# Patient Record
Sex: Female | Born: 1965 | Race: Black or African American | Hispanic: No | Marital: Married | Smoking: Never smoker
Health system: Southern US, Community
[De-identification: ages and names within clinical notes are randomized; demographics above are authoritative.]

## PROBLEM LIST (undated history)

## (undated) DIAGNOSIS — I10 Essential (primary) hypertension: Secondary | ICD-10-CM

## (undated) DIAGNOSIS — E079 Disorder of thyroid, unspecified: Secondary | ICD-10-CM

## (undated) DIAGNOSIS — J45909 Unspecified asthma, uncomplicated: Secondary | ICD-10-CM

---

## 2009-05-12 ENCOUNTER — Encounter (INDEPENDENT_AMBULATORY_CARE_PROVIDER_SITE_OTHER): Payer: Self-pay | Admitting: *Deleted

## 2009-05-20 ENCOUNTER — Ambulatory Visit: Payer: Self-pay | Admitting: Internal Medicine

## 2009-05-20 DIAGNOSIS — I10 Essential (primary) hypertension: Secondary | ICD-10-CM | POA: Insufficient documentation

## 2009-05-20 DIAGNOSIS — K219 Gastro-esophageal reflux disease without esophagitis: Secondary | ICD-10-CM | POA: Insufficient documentation

## 2009-05-20 DIAGNOSIS — J45909 Unspecified asthma, uncomplicated: Secondary | ICD-10-CM | POA: Insufficient documentation

## 2009-05-20 DIAGNOSIS — R809 Proteinuria, unspecified: Secondary | ICD-10-CM

## 2009-05-20 DIAGNOSIS — E785 Hyperlipidemia, unspecified: Secondary | ICD-10-CM

## 2009-05-20 DIAGNOSIS — E039 Hypothyroidism, unspecified: Secondary | ICD-10-CM | POA: Insufficient documentation

## 2009-05-20 LAB — CONVERTED CEMR LAB
Ketones, urine, test strip: NEGATIVE
Nitrite: NEGATIVE
Specific Gravity, Urine: 1.01
Urobilinogen, UA: 0.2
WBC Urine, dipstick: NEGATIVE

## 2009-06-18 ENCOUNTER — Ambulatory Visit: Payer: Self-pay | Admitting: Internal Medicine

## 2010-06-07 NOTE — Letter (Signed)
Summary: New Patient Letter   at [place]  [address] [place], [place] [address]   Phone: [phone]  Fax: [phone]       05/12/2009 MRN: [id number]  [facility] [address], [place]  [address]  Dear Jasmine Kramer,   Welcome to [facility] and thank you for choosing [place] as your Primary Care Providers. Enclosed you will find information about our practice that we hope you find helpful. We have also enclosed forms to be filled out prior to your visit. This will provide [place] with the necessary information and facilitate your being seen in a timely manner. If you have any questions, please call us at:  [phone]        and we will be happy to assist you. We look forward to seeing you at your scheduled appointment time.  Appointment   Jasmine Kramer [phone] @ 2:00PM            with Dr.  Alwyn Ren               Sincerely,  Primary Health Care Team  Please arrive 15 minutes early for your first appointment and bring your insurance card. Co-pay is required at the time of your visit.  *****Please call the office if you are not able to keep this appointment. There is a charge of $50.00 if any appointment is not cancelled or rescheduled within 24 hours.

## 2010-06-07 NOTE — Assessment & Plan Note (Signed)
Summary: NEW PT/BCBS/NS/KDC   Vital Signs:  Patient profile:   45 year old female Height:      67 inches Weight:      241.8 pounds BMI:     38.01 Temp:     98.4 degrees F oral Pulse rate:   64 / minute Resp:     14 per minute BP sitting:   122 / 84  (left arm) Cuff size:   large  Vitals Entered By: Shonna Chock (May 20, 2009 2:18 PM) CC: New patient establish: microscopic blood in urine for a long time seen urologist and Kidney specialist in [place], Lipid Management, Hypertension Management Comments REVIEWED MED LIST, PATIENT AGREED DOSE AND INSTRUCTION CORRECT    CC:  New patient establish: microscopic blood in urine for a long time seen urologist and Kidney specialist in [place], Lipid Management, and Hypertension Management.  History of Present Illness: Jasmine Kramer is here to establish as a new patient; she has relocated from [place], [place]. She had pre-eclampsia in 2003 with subsequent proteinuria which decreased. This has been evaluated by a Urologist & Nephrologist.   Hypertension History:      She complains of peripheral edema and neurologic problems, but denies headache, chest pain, palpitations, dyspnea with exertion, orthopnea, PND, visual symptoms, syncope, and side effects from treatment.  BP @ home 125/84-87. Premenstrual edema. Intermittent numbness RUE after sleeping.        Positive major cardiovascular risk factors include hyperlipidemia and hypertension.  Negative major cardiovascular risk factors include female age less than 75 years old, no history of diabetes, negative family history for ischemic heart disease, and non-tobacco-user status.        Further assessment for target organ damage reveals no history of ASHD, stroke/TIA, or peripheral vascular disease.    Lipid Management History:      Positive NCEP/ATP III risk factors include hypertension.  Negative NCEP/ATP III risk factors include female age less than 11 years old, no history of early menopause  without estrogen hormone replacement, non-diabetic, no family history for ischemic heart disease, non-tobacco-user status, no ASHD (atherosclerotic heart disease), no prior stroke/TIA, no peripheral vascular disease, and no history of aortic aneurysm.      Preventive Screening-Counseling & Management  Alcohol-Tobacco     Smoking Status: quit  Caffeine-Diet-Exercise     Does Patient Exercise: no  Allergies (verified): No Known Drug Allergies  Past History:  Past Medical History: Asthma GERD Hypertension Hypothyroidism Hyperlipidemia(TC 213, LDL ? > 160)  Past Surgical History: G4 P2, M1, A1;Caesarean section x 2; Wisdom Teeth extraction  Family History: Father: Deceased, ETOH-suicide Mother: Living,HTN;  Johna Sheriff; MGF-DM Siblings: 1 living sisterHTN,high chlosterol), 1 living brother, 1 deceased brother(homicide)  Social History: Occupation:Licensed Therapist Married Former Smoker: quit 1994 Alcohol use-no Regular exercise-no Smoking Status:  quit Does Patient Exercise:  no  Review of Systems General:  Denies fatigue and weight loss; Weight up 20#. Eyes:  Denies blurring, double vision, and vision loss-both eyes. ENT:  Denies difficulty swallowing and hoarseness. Resp:  Complains of shortness of breath and wheezing; denies cough and sputum productive; Ran out  Symbicort; rescue MDI three times a day prn. GI:  Denies abdominal pain, bloody stools, dark tarry stools, and indigestion. GU:  Complains of urinary frequency; denies discharge, dysuria, nocturia, and urinary hesitancy; Frequency with diuretic; nocturia X 1. Derm:  Complains of hair loss; denies changes in nail beds and dryness; Cyclical hair loss. Neuro:  Complains of numbness; denies tingling. Psych:  Denies anxiety and depression.  Endo:  Complains of cold intolerance and heat intolerance; variable temp intolerance.  Physical Exam  General:  well-nourished,in no acute distress; alert,appropriate and  cooperative throughout examination Eyes:  No corneal or conjunctival inflammation noted. No lid lag.Perrla. Neck:  No deformities, masses, or tenderness noted. Slight asymmetry of thyroid <R> L w/o nodule Lungs:  Normal respiratory effort, chest expands symmetrically. Lungs are clear to auscultation, no crackles or wheezes. Heart:  Normal rate and regular rhythm. S1 and S2 normal without gallop, murmur, click, rub. S4 Abdomen:  Bowel sounds positive,abdomen soft and non-tender without masses, organomegaly or hernias noted. Pulses:  R and L carotid,radial,dorsalis pedis and posterior tibial pulses are full and equal bilaterally Extremities:  No clubbing, cyanosis, edema. Neurologic:  alert & oriented X3 and DTRs symmetrical and normal.   Skin:  Intact without suspicious lesions or rashes Cervical Nodes:  No lymphadenopathy noted Axillary Nodes:  No palpable lymphadenopathy Psych:  memory intact for recent and remote, normally interactive, and good eye contact.     Impression & Recommendations:  Problem # 1:  PROTEINURIA (ICD-791.0)  see UA; menses completed 05/19/2009  Problem # 2:  HYPERTENSION (ICD-401.9) Controlled Her updated medication list for this problem includes:    Diovan Hct 160-12.5 Mg Tabs (Valsartan-hydrochlorothiazide) .Marland Kitchen... 1 by mouth once daily  Problem # 3:  HYPOTHYROIDISM (ICD-244.9)  Her updated medication list for this problem includes:    Levothyroxine Sodium 100 Mcg Tabs (Levothyroxine sodium) .Marland Kitchen... 1 by mouth once daily  Problem # 4:  GERD (ICD-530.81)  Her updated medication list for this problem includes:    Famotidine 20 Mg Tabs (Famotidine) .Marland Kitchen... 1 two times a day  Problem # 5:  ASTHMA (ICD-493.90)  Her updated medication list for this problem includes:    Symbicort 80-4.5 Mcg/act Aero (Budesonide-formoterol fumarate) .Marland Kitchen... As directed    Proair Hfa 108 (90 Base) Mcg/act Aers (Albuterol sulfate) .Marland Kitchen... As needed  Complete Medication List: 1)   Diovan Hct 160-12.5 Mg Tabs (Valsartan-hydrochlorothiazide) .Marland Kitchen.. 1 by mouth once daily 2)  Symbicort 80-4.5 Mcg/act Aero (Budesonide-formoterol fumarate) .... As directed 3)  Famotidine 20 Mg Tabs (Famotidine) .Marland Kitchen.. 1 two times a day 4)  Levothyroxine Sodium 100 Mcg Tabs (Levothyroxine sodium) .Marland Kitchen.. 1 by mouth once daily 5)  Proair Hfa 108 (90 Base) Mcg/act Aers (Albuterol sulfate) .... As needed  Other Orders: UA Dipstick w/o Micro (manual) (60454)  Hypertension Assessment/Plan:      The patient's hypertensive risk group is category B: At least one risk factor (excluding diabetes) with no target organ damage.  Today's blood pressure is 122/84.    Lipid Assessment/Plan:      Based on NCEP/ATP III, the patient's risk factor category is "0-1 risk factors".  The patient's lipid goals are as follows: Total cholesterol goal is 200; LDL cholesterol goal is 160; HDL cholesterol goal is 40; Triglyceride goal is 150.  Her LDL cholesterol goal has not been met.  Secondary causes for hyperlipidemia have been ruled out.  She has been counseled on adjunctive measures for lowering her cholesterol and has been provided with dietary instructions.    Patient Instructions: 1)  40 oz of water / day; < 35 inches @ waist; 30 min of walking 3X/week;LESS THAN 25  grams of sugar / day from LABELED foods & drinks with High Fructose Corn Syrup as #1,2 or #3 on label. Go to complex carbs. 2)  Please schedule a follow-up appointment in 4 months. 3)  BUN,creat,K+ prior to visit, ICD-9:401.9 4)  NMR Lipoprofile Lipid Panel prior to visit, ICD-9:272.4 5)  TSH prior to visit, ICD-9:244.9 6)  HbgA1C prior to visit, ICD-9:790.29 Prescriptions: FAMOTIDINE 20 MG TABS (FAMOTIDINE) 1 two times a day  #180 x 1   Entered and Authorized by:   Marga Melnick MD   Signed by:   Marga Melnick MD on 05/20/2009   Method used:   Print then Give to Patient   RxID:   [id number] SYMBICORT 80-4.5 MCG/ACT AERO (BUDESONIDE-FORMOTEROL  FUMARATE) AS DIRECTED  #1 x 11   Entered and Authorized by:   Marga Melnick MD   Signed by:   Marga Melnick MD on 05/20/2009   Method used:   Print then Give to Patient   RxID:   [id number]   Laboratory Results   Urine Tests    Routine Urinalysis   Color: yellow Appearance: Clear Glucose: negative   (Normal Range: Negative) Bilirubin: negative   (Normal Range: Negative) Ketone: negative   (Normal Range: Negative) Spec. Gravity: 1.010   (Normal Range: 1.003-1.035) Blood: large   (Normal Range: Negative) pH: 7.0   (Normal Range: 5.0-8.0) Protein: negative   (Normal Range: Negative) Urobilinogen: 0.2   (Normal Range: 0-1) Nitrite: negative   (Normal Range: Negative) Leukocyte Esterace: negative   (Normal Range: Negative)    Comments: LMP: 05/13/2009, FINISHED YESTERDAY: 05/19/2009

## 2010-07-23 ENCOUNTER — Emergency Department (HOSPITAL_BASED_OUTPATIENT_CLINIC_OR_DEPARTMENT_OTHER)
Admission: EM | Admit: 2010-07-23 | Discharge: 2010-07-23 | Disposition: A | Discharge status: Home / Self Care | Payer: BC Managed Care – PPO | Attending: Emergency Medicine | Admitting: Emergency Medicine

## 2010-07-23 ENCOUNTER — Emergency Department (INDEPENDENT_AMBULATORY_CARE_PROVIDER_SITE_OTHER): Payer: BC Managed Care – PPO

## 2010-07-23 DIAGNOSIS — R509 Fever, unspecified: Secondary | ICD-10-CM

## 2010-07-23 DIAGNOSIS — R0602 Shortness of breath: Secondary | ICD-10-CM | POA: Insufficient documentation

## 2010-07-23 DIAGNOSIS — R079 Chest pain, unspecified: Secondary | ICD-10-CM

## 2010-07-23 DIAGNOSIS — J45909 Unspecified asthma, uncomplicated: Secondary | ICD-10-CM | POA: Insufficient documentation

## 2010-07-23 DIAGNOSIS — Z79899 Other long term (current) drug therapy: Secondary | ICD-10-CM | POA: Insufficient documentation

## 2010-07-23 DIAGNOSIS — R05 Cough: Secondary | ICD-10-CM

## 2010-07-23 DIAGNOSIS — J069 Acute upper respiratory infection, unspecified: Secondary | ICD-10-CM | POA: Insufficient documentation

## 2013-03-09 ENCOUNTER — Emergency Department (HOSPITAL_BASED_OUTPATIENT_CLINIC_OR_DEPARTMENT_OTHER)
Admission: EM | Admit: 2013-03-09 | Discharge: 2013-03-09 | Disposition: A | Discharge status: Home / Self Care | Payer: Managed Care, Other (non HMO) | Attending: Emergency Medicine | Admitting: Emergency Medicine

## 2013-03-09 ENCOUNTER — Emergency Department (HOSPITAL_BASED_OUTPATIENT_CLINIC_OR_DEPARTMENT_OTHER): Payer: Managed Care, Other (non HMO)

## 2013-03-09 ENCOUNTER — Encounter (HOSPITAL_BASED_OUTPATIENT_CLINIC_OR_DEPARTMENT_OTHER): Payer: Self-pay | Admitting: Emergency Medicine

## 2013-03-09 DIAGNOSIS — I10 Essential (primary) hypertension: Secondary | ICD-10-CM | POA: Insufficient documentation

## 2013-03-09 DIAGNOSIS — J189 Pneumonia, unspecified organism: Secondary | ICD-10-CM

## 2013-03-09 DIAGNOSIS — J3489 Other specified disorders of nose and nasal sinuses: Secondary | ICD-10-CM | POA: Insufficient documentation

## 2013-03-09 DIAGNOSIS — J45909 Unspecified asthma, uncomplicated: Secondary | ICD-10-CM | POA: Insufficient documentation

## 2013-03-09 DIAGNOSIS — E079 Disorder of thyroid, unspecified: Secondary | ICD-10-CM | POA: Insufficient documentation

## 2013-03-09 DIAGNOSIS — Z79899 Other long term (current) drug therapy: Secondary | ICD-10-CM | POA: Insufficient documentation

## 2013-03-09 DIAGNOSIS — Z792 Long term (current) use of antibiotics: Secondary | ICD-10-CM | POA: Insufficient documentation

## 2013-03-09 DIAGNOSIS — J159 Unspecified bacterial pneumonia: Secondary | ICD-10-CM | POA: Insufficient documentation

## 2013-03-09 HISTORY — DX: Essential (primary) hypertension: I10

## 2013-03-09 HISTORY — DX: Unspecified asthma, uncomplicated: J45.909

## 2013-03-09 HISTORY — DX: Disorder of thyroid, unspecified: E07.9

## 2013-03-09 LAB — CBC WITH DIFFERENTIAL/PLATELET
Basophils Absolute: 0 10*3/uL (ref 0.0–0.1)
HCT: 38.4 % (ref 36.0–46.0)
Hemoglobin: 12.6 g/dL (ref 12.0–15.0)
Lymphocytes Relative: 47 % — ABNORMAL HIGH (ref 12–46)
Monocytes Absolute: 0.5 10*3/uL (ref 0.1–1.0)
Monocytes Relative: 10 % (ref 3–12)
Neutro Abs: 2.3 10*3/uL (ref 1.7–7.7)
Neutrophils Relative %: 41 % — ABNORMAL LOW (ref 43–77)
RDW: 13.4 % (ref 11.5–15.5)
WBC: 5.7 10*3/uL (ref 4.0–10.5)

## 2013-03-09 LAB — BASIC METABOLIC PANEL
CO2: 26 mEq/L (ref 19–32)
Chloride: 103 mEq/L (ref 96–112)
Creatinine, Ser: 1.5 mg/dL — ABNORMAL HIGH (ref 0.50–1.10)
Glucose, Bld: 146 mg/dL — ABNORMAL HIGH (ref 70–99)
Potassium: 3.4 mEq/L — ABNORMAL LOW (ref 3.5–5.1)

## 2013-03-09 MED ORDER — IOHEXOL 350 MG/ML SOLN
100.0000 mL | Freq: Once | INTRAVENOUS | Status: AC | PRN
  Administered 2013-03-09: 80 mL via INTRAVENOUS

## 2013-03-09 MED ORDER — ALBUTEROL SULFATE (5 MG/ML) 0.5% IN NEBU
5.0000 mg | INHALATION_SOLUTION | Freq: Once | RESPIRATORY_TRACT | Status: AC
  Administered 2013-03-09: 5 mg via RESPIRATORY_TRACT
  Filled 2013-03-09: qty 1

## 2013-03-09 MED ORDER — HYDROCODONE-ACETAMINOPHEN 5-325 MG PO TABS
1.0000 | ORAL_TABLET | Freq: Once | ORAL | Status: AC
  Administered 2013-03-09: 1 via ORAL
  Filled 2013-03-09: qty 1

## 2013-03-09 MED ORDER — AZITHROMYCIN 250 MG PO TABS: 250.0000 mg | ORAL_TABLET | Freq: Every day | ORAL | Status: AC

## 2013-03-09 MED ORDER — SODIUM CHLORIDE 0.9 % IV SOLN: 1000.0000 mL | INTRAVENOUS | Status: DC

## 2013-03-09 MED ORDER — SODIUM CHLORIDE 0.9 % IV SOLN
1000.0000 mL | Freq: Once | INTRAVENOUS | Status: AC
  Administered 2013-03-09: 1000 mL via INTRAVENOUS

## 2013-03-09 MED ORDER — AMOXICILLIN ER 775 MG PO TB24: 775.0000 mg | ORAL_TABLET | Freq: Every day | ORAL | Status: AC

## 2013-03-09 MED ORDER — HYDROCODONE-ACETAMINOPHEN 5-325 MG PO TABS: 1.0000 | ORAL_TABLET | ORAL | Status: AC | PRN

## 2013-03-09 MED ORDER — HYDROCODONE-ACETAMINOPHEN 5-325 MG PO TABS: 1.0000 | ORAL_TABLET | Freq: Once | ORAL | Status: DC

## 2013-03-09 NOTE — ED Provider Notes (Signed)
CSN: [id number]     Arrival date & time 03/09/13  4098 History   First MD Initiated Contact with Patient 03/09/13 [phone]     Chief Complaint  Patient presents with  . Cough    Patient is a 47 y.o. female presenting with cough. The history is provided by the patient.  Cough Cough characteristics:  Dry Onset quality:  Gradual Duration:  3 days Timing:  Constant Progression:  Worsening Smoker: no   Context: upper respiratory infection   Relieved by:  Nothing Ineffective treatments:  Beta-agonist inhaler Associated symptoms: chest pain (sharp pain on the right, pain increases with movement of right arm), rhinorrhea and sinus congestion   Associated symptoms: no fever, no myalgias, no rash and no sore throat     Past Medical History  Diagnosis Date  . Asthma   . Hypertension   . Thyroid disease    History reviewed. No pertinent past surgical history. No family history on file. History  Substance Use Topics  . Smoking status: Never Smoker   . Smokeless tobacco: Not on file  . Alcohol Use: Not on file   OB History   Grav Para Term Preterm Abortions TAB SAB Ect Mult Living                 Review of Systems  Constitutional: Negative for fever.  HENT: Positive for rhinorrhea. Negative for sore throat.   Respiratory: Positive for cough.   Cardiovascular: Positive for chest pain (sharp pain on the right, pain increases with movement of right arm).  Musculoskeletal: Negative for myalgias.  Skin: Negative for rash.  All other systems reviewed and are negative.    Allergies  Review of patient's allergies indicates no known allergies.  Home Medications   Current Outpatient Rx  Name  Route  Sig  Dispense  Refill  . budesonide-formoterol (SYMBICORT) 160-4.5 MCG/ACT inhaler   Inhalation   Inhale 2 puffs into the lungs 2 (two) times daily.         Marland Kitchen levothyroxine (SYNTHROID, LEVOTHROID) 100 MCG tablet   Oral   Take 100 mcg by mouth daily before breakfast.         .  valsartan (DIOVAN) 160 MG tablet   Oral   Take 160 mg by mouth daily.         Marland Kitchen amoxicillin (MOXATAG) 775 MG 24 hr tablet   Oral   Take 1 tablet (775 mg total) by mouth daily.   20 tablet   0   . azithromycin (ZITHROMAX) 250 MG tablet   Oral   Take 1 tablet (250 mg total) by mouth daily. Take first 2 tablets together, then 1 every day until finished.   6 tablet   0   . HYDROcodone-acetaminophen (NORCO/VICODIN) 5-325 MG per tablet   Oral   Take 1-2 tablets by mouth every 4 (four) hours as needed for pain.   16 tablet   0    BP 135/77  Pulse 79  Temp(Src) 98 F (36.7 C) (Oral)  Resp 20  Ht 5\' 8"  (1.727 m)  Wt 262 lb (118.842 kg)  BMI 39.85 kg/m2  SpO2 100%  LMP 03/08/2013 Physical Exam  Nursing note and vitals reviewed. Constitutional: She appears well-developed and well-nourished. No distress.  HENT:  Head: Normocephalic and atraumatic.  Right Ear: External ear normal.  Left Ear: External ear normal.  Eyes: Conjunctivae are normal. Right eye exhibits no discharge. Left eye exhibits no discharge. No scleral icterus.  Neck: Neck supple. No  tracheal deviation present.  Cardiovascular: Normal rate, regular rhythm and intact distal pulses.   Pulmonary/Chest: Effort normal and breath sounds normal. No stridor. No respiratory distress. She has no wheezes. She has no rales. She exhibits tenderness.  Abdominal: Soft. Bowel sounds are normal. She exhibits no distension. There is no tenderness. There is no rebound and no guarding.  Musculoskeletal: She exhibits no edema and no tenderness.  Neurological: She is alert. She has normal strength. No sensory deficit. Cranial nerve deficit:  no gross defecits noted. She exhibits normal muscle tone. She displays no seizure activity. Coordination normal.  Skin: Skin is warm and dry. No rash noted.  Psychiatric: She has a normal mood and affect.    ED Course  Procedures (including critical care time) Labs Review Labs Reviewed  CBC  WITH DIFFERENTIAL - Abnormal; Notable for the following:    Neutrophils Relative % 41 (*)    Lymphocytes Relative 47 (*)    All other components within normal limits  BASIC METABOLIC PANEL - Abnormal; Notable for the following:    Potassium 3.4 (*)    Glucose, Bld 146 (*)    Creatinine, Ser 1.50 (*)    GFR calc non Af Amer 40 (*)    GFR calc Af Amer 47 (*)    All other components within normal limits   Imaging Review Dg Chest 2 View  03/09/2013   CLINICAL DATA:  Dry cough and chest wall pain for 3 days. Sputum production. History of asthma and hypertension.  EXAM: CHEST  2 VIEW  COMPARISON:  07/23/2010  FINDINGS: The heart is mildly enlarged. The right lung base, there is a rounded density, raising the question of mass or focal consolidation. Further evaluation with chest CT is recommended. There is no evidence for adenopathy. No pulmonary edema. Visualized osseous structures have a normal appearance.  IMPRESSION: 1. Cardiomegaly without pulmonary edema. 2. Question of right lower lobe mass. Further evaluation with CT of the chest is recommended. Contrast administration is recommended unless contraindicated.   Electronically Signed   By: Rosalie Gums M.D.   On: 03/09/2013 09:16   Ct Chest W Contrast  03/09/2013   CLINICAL DATA:  Initial encounter for abnormality identified on chest imaging earlier same date, possible mass in the right lower lobe. Patient presented to the emergency department with cough and chest pain. Current history of asthma and hypertension. Former smoker.  EXAM: CT CHEST WITH CONTRAST  TECHNIQUE: Multidetector CT imaging of the chest was performed during intravenous contrast administration.  CONTRAST:  80mL OMNIPAQUE IOHEXOL 350 MG/ML IV.  COMPARISON:  No prior CT. Two-view chest x-ray earlier same date and 07/23/2010.  FINDINGS: Focal patchy airspace consolidation deep in the right lower lobe laterally accounts for the abnormality identified on the earlier chest imaging. No  evidence of parenchymal nodule in either lung. Lungs otherwise clear. No evidence of interstitial disease. No pleural effusions. Central airways patent with mild to moderate bronchial wall thickening.  Heart size upper normal. No visible coronary atherosclerosis. No pericardial effusion. No visible atherosclerosis involving the thoracic or upper abdominal aorta or their visualized branches. Anatomic variant in that the left common carotid artery arises from the innominate artery (bovine arch).  Normal sized lymph nodes in the hila, mediastinum, and axillae; no significant lymphadenopathy. Visualized thyroid gland unremarkable.  Visualized upper abdomen unremarkable. Focus of accessory splenic tissue anterior to the mid spleen. Bone window images demonstrate mild mid and lower thoracic spondylosis.  IMPRESSION: 1. No evidence of lung nodule  as questioned on earlier imaging. 2. Patchy pneumonia in the deep right lower lobe accounts for the abnormality on the earlier chest imaging. No evidence of pneumonia elsewhere in either lung. 3. Moderate central bronchial wall thickening consistent with the given history of asthma.   Electronically Signed   By: Hulan Saas M.D.   On: 03/09/2013 11:03    EKG Interpretation   None       MDM   1. CAP (community acquired pneumonia)     The patient's chest x-ray showed a questionable right lower lobe mass. CT scan shows a what appears to be a patchy pneumonia but no evidence of mass.  Patient is nontoxic. I will treat her for a community-acquired pneumonia. I will add amoxicillin to cover for possible streptococcal pneumonia considering the azithromycin resistance in this area.   Celene Kras, MD 03/09/13 [phone]

## 2013-03-09 NOTE — ED Notes (Signed)
Patient here with dry cough and chestwall pain x 3 days. Reports that if she coughs hard enough she can get clear sputum up. Pain with inspiration. Hx of asthma. No other associated symptoms

## 2014-04-07 IMAGING — CT CT CHEST W/ CM
2 of 3 series · 15 of 36 positions shown, 18 images · IV contrast (APPLIED)
Comparison: No prior CT. Two-view chest x-ray earlier same date and
07/23/2010.

CLINICAL DATA: Initial encounter for abnormality identified on
chest imaging earlier same date, possible mass in the right lower
lobe. Patient presented to the emergency department with cough and
chest pain. Current history of asthma and hypertension. Former
smoker.

EXAM:
CT CHEST WITH CONTRAST
TECHNIQUE: Multidetector CT imaging of the chest was performed during
intravenous contrast administration.
CONTRAST:  80mL OMNIPAQUE IOHEXOL 350 MG/ML IV.

[Series 2: chest 5.0 b31f · axial · 0.63mm/px · z∈[+1171,+1416]mm · 12 of 59 slices shown, 15 images]
[im 5/59  mediastinal]
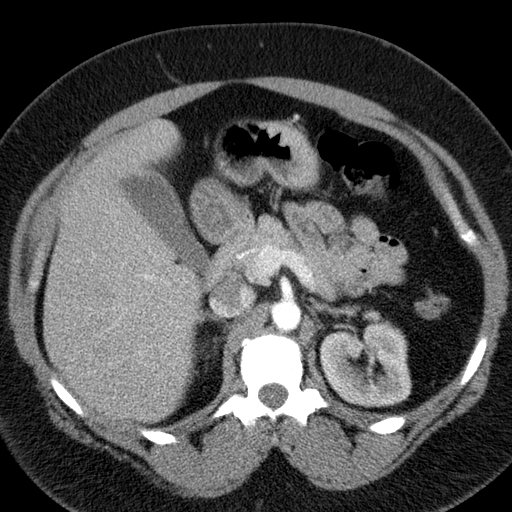
[im 5/59  lung]
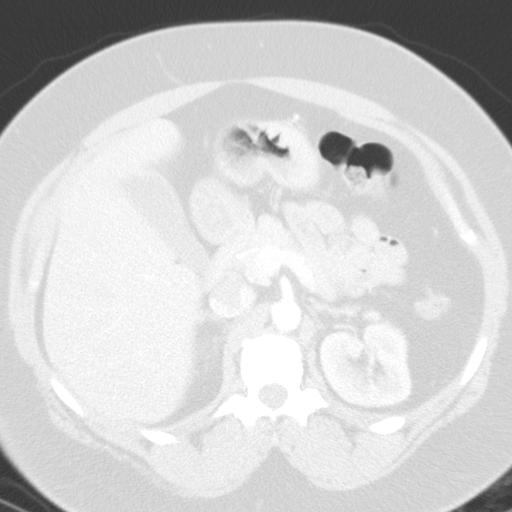
[im 9/59  lung]
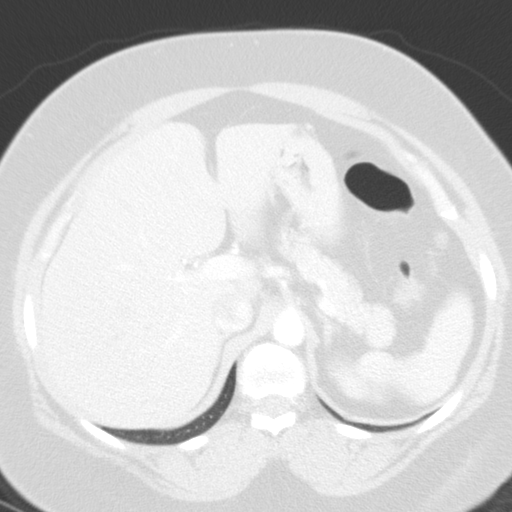
[im 13/59  lung]
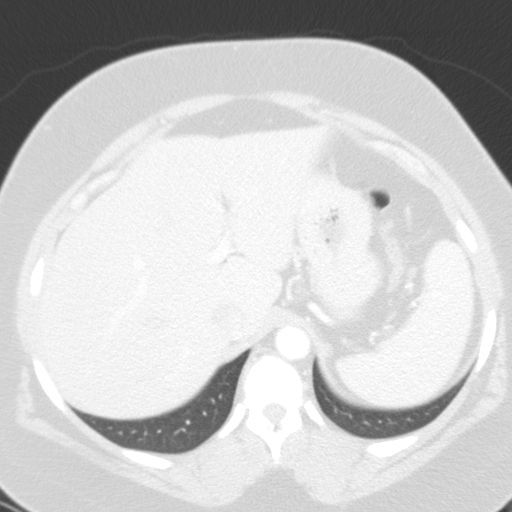
[im 18/59  lung]
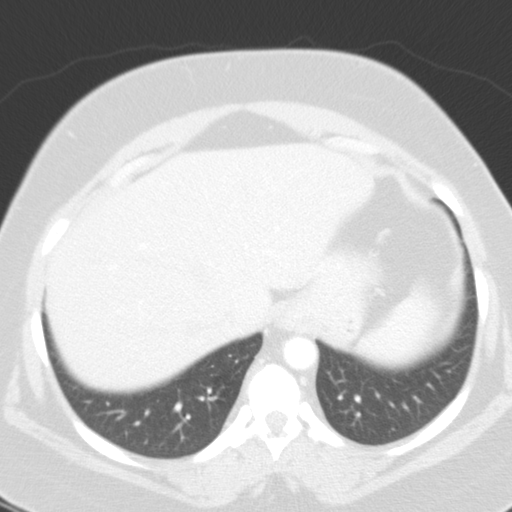
[im 22/59  mediastinal]
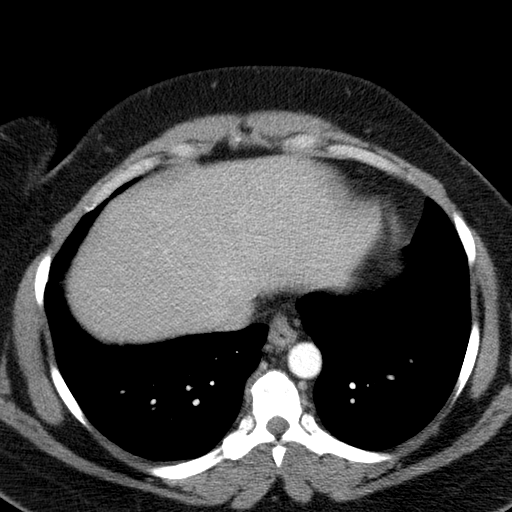
[im 22/59  lung]
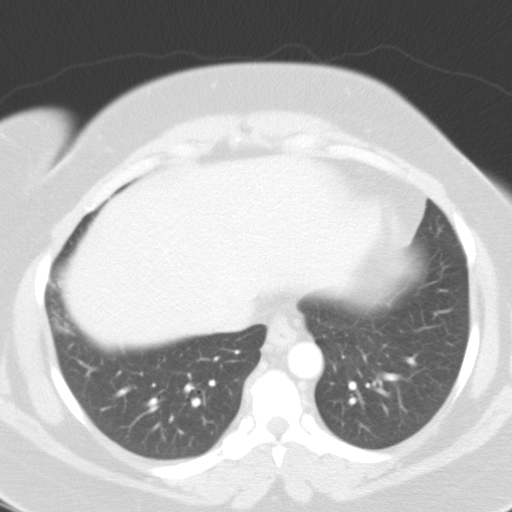
[im 26/59  lung]
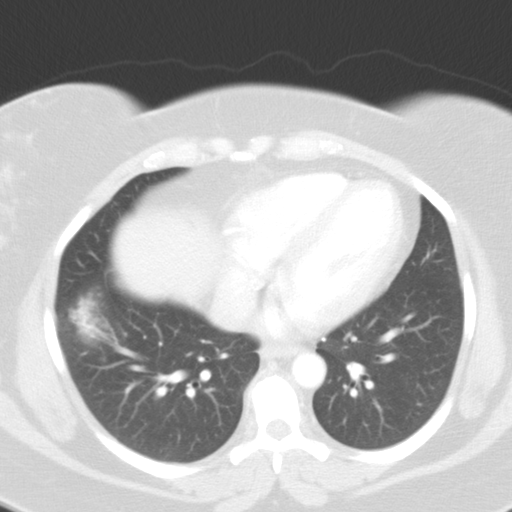
[im 33/59  lung]
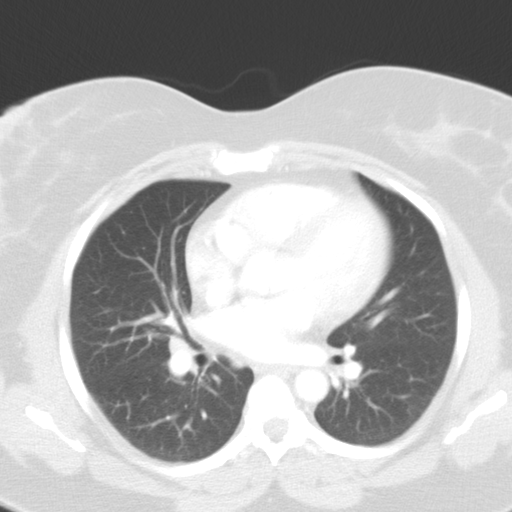
[im 37/59  lung]
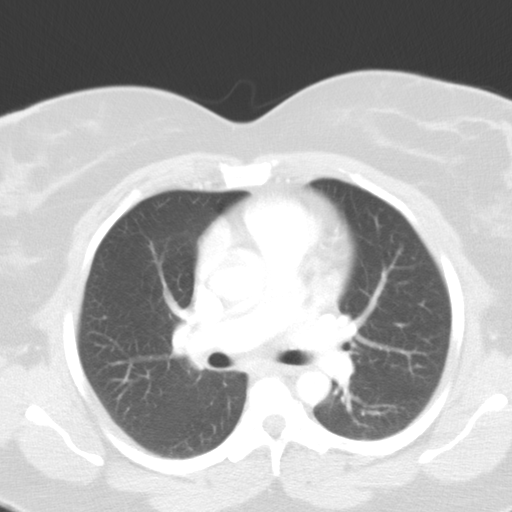
[im 41/59  mediastinal]
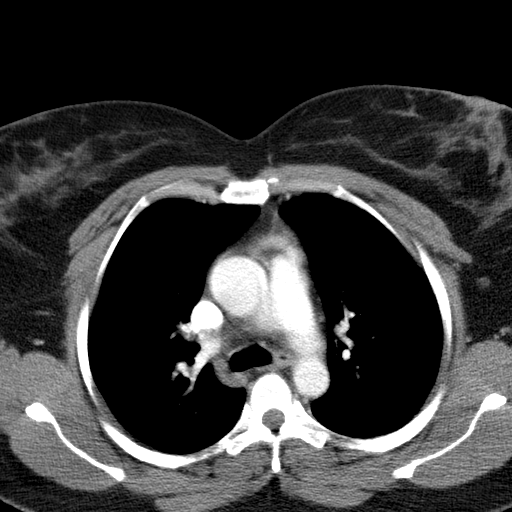
[im 41/59  lung]
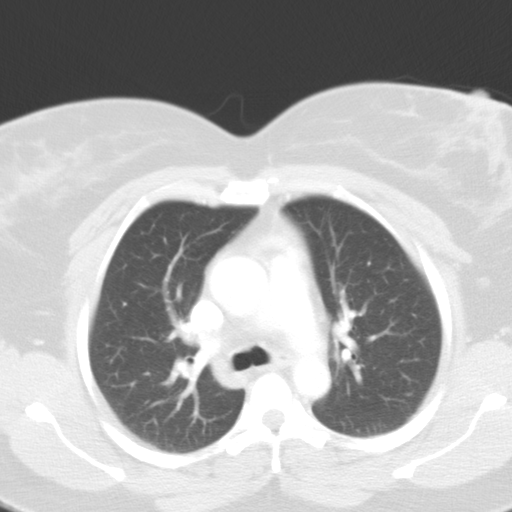
[im 46/59  lung]
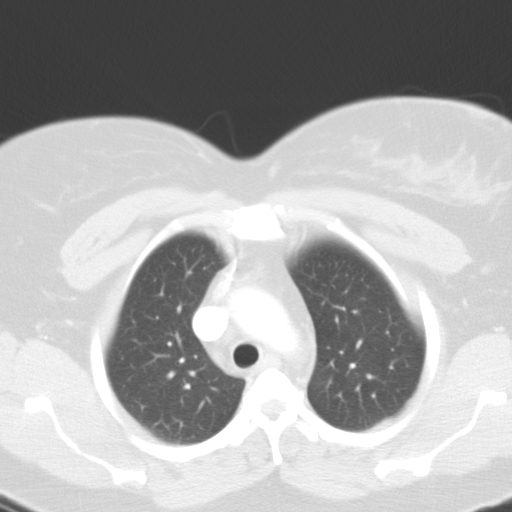
[im 50/59  lung]
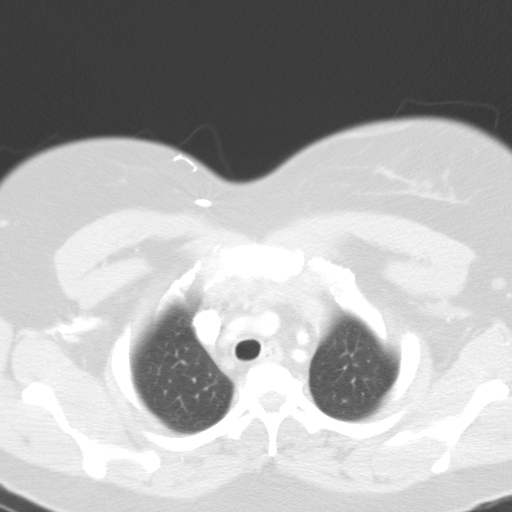
[im 54/59  lung]
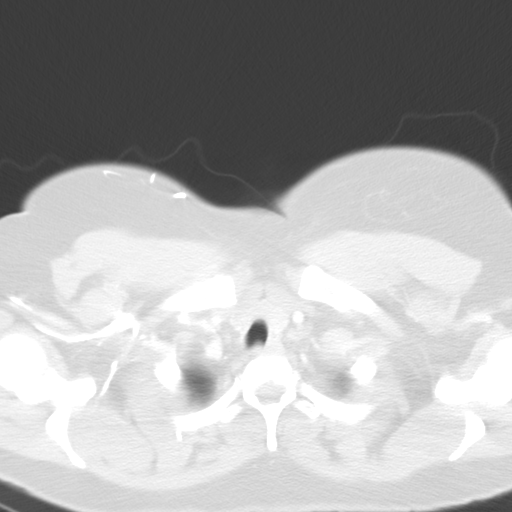

[Series 6: chest 3.0 coronal · coronal · 0.71mm/px · 3 of 81 slices shown]
[im 17/81  lung]
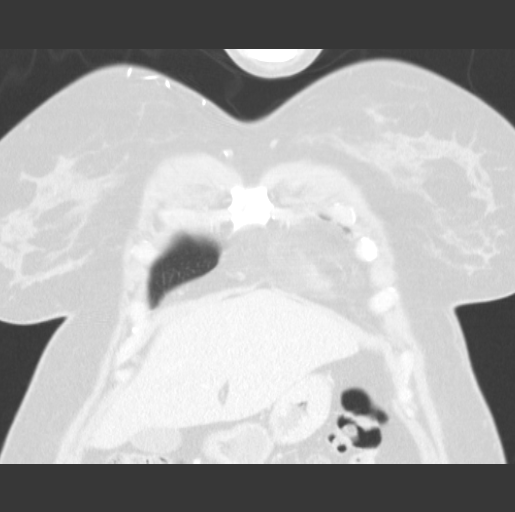
[im 33/81  lung]
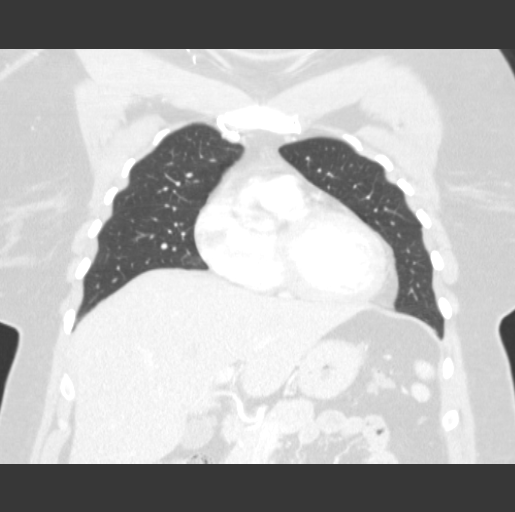
[im 49/81  lung]
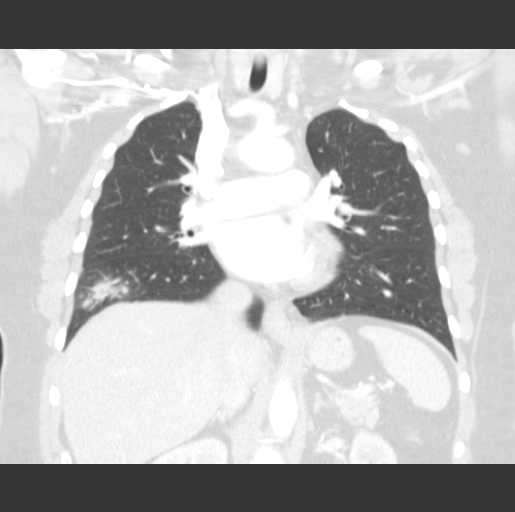

[15 of 36 positions shown; findings below may reference images not displayed]

FINDINGS: Focal patchy airspace consolidation deep in the right lower lobe
laterally accounts for the abnormality identified on the earlier
chest imaging. No evidence of parenchymal nodule in either lung.
Lungs otherwise clear. No evidence of interstitial disease. No
pleural effusions. Central airways patent with mild to moderate
bronchial wall thickening.

Heart size upper normal. No visible coronary atherosclerosis. No
pericardial effusion. No visible atherosclerosis involving the
thoracic or upper abdominal aorta or their visualized branches.
Anatomic variant in that the left common carotid artery arises from
the innominate artery (bovine arch).

Normal sized lymph nodes in the hila, mediastinum, and axillae; no
significant lymphadenopathy. Visualized thyroid gland unremarkable.

Visualized upper abdomen unremarkable. Focus of accessory splenic
tissue anterior to the mid spleen. Bone window images demonstrate
mild mid and lower thoracic spondylosis.
IMPRESSION: 1. No evidence of lung nodule as questioned on earlier imaging.
2. Patchy pneumonia in the deep right lower lobe accounts for the
abnormality on the earlier chest imaging. No evidence of pneumonia
elsewhere in either lung.
3. Moderate central bronchial wall thickening consistent with the
given history of asthma.

## 2016-08-08 ENCOUNTER — Emergency Department (HOSPITAL_BASED_OUTPATIENT_CLINIC_OR_DEPARTMENT_OTHER)
Admission: EM | Admit: 2016-08-08 | Discharge: 2016-08-08 | Disposition: A | Discharge status: Home / Self Care | Payer: Managed Care, Other (non HMO) | Attending: Dermatology | Admitting: Dermatology

## 2016-08-08 ENCOUNTER — Encounter (HOSPITAL_BASED_OUTPATIENT_CLINIC_OR_DEPARTMENT_OTHER): Payer: Self-pay | Admitting: Emergency Medicine

## 2016-08-08 DIAGNOSIS — G43909 Migraine, unspecified, not intractable, without status migrainosus: Secondary | ICD-10-CM | POA: Insufficient documentation

## 2016-08-08 DIAGNOSIS — J45909 Unspecified asthma, uncomplicated: Secondary | ICD-10-CM | POA: Insufficient documentation

## 2016-08-08 DIAGNOSIS — Z5321 Procedure and treatment not carried out due to patient leaving prior to being seen by health care provider: Secondary | ICD-10-CM | POA: Insufficient documentation

## 2016-08-08 DIAGNOSIS — I10 Essential (primary) hypertension: Secondary | ICD-10-CM | POA: Insufficient documentation

## 2019-07-17 ENCOUNTER — Ambulatory Visit: Payer: Self-pay | Attending: Internal Medicine

## 2019-07-17 DIAGNOSIS — Z23 Encounter for immunization: Secondary | ICD-10-CM

## 2019-07-17 NOTE — Progress Notes (Signed)
   Covid-19 Vaccination Clinic  Name:  Jasmine Kramer    MRN: [id number] DOB: 07-12-1965  07/17/2019  Jasmine Kramer was observed post Covid-19 immunization for 15 minutes without incident. She was provided with Vaccine Information Sheet and instruction to access the V-Safe system.   Jasmine Kramer was instructed to call 911 with any severe reactions post vaccine: Marland Kitchen Difficulty breathing  . Swelling of face and throat  . A fast heartbeat  . A bad rash all over body  . Dizziness and weakness   Immunizations Administered    Name Date Dose VIS Date Route   Moderna COVID-19 Vaccine 07/17/2019  3:27 PM 0.5 mL 04/08/2019 Intramuscular   Manufacturer: Moderna   Lot: [id number]   NDC: 29574-734-03

## 2019-08-19 ENCOUNTER — Ambulatory Visit: Payer: Self-pay | Attending: Family

## 2019-08-19 DIAGNOSIS — Z23 Encounter for immunization: Secondary | ICD-10-CM

## 2019-08-19 NOTE — Progress Notes (Signed)
   Covid-19 Vaccination Clinic  Name:  Maitlyn Penza    MRN: [id number] DOB: February 25, 1966  08/19/2019  Ms. Hilgert was observed post Covid-19 immunization for 15 minutes without incident. She was provided with Vaccine Information Sheet and instruction to access the V-Safe system.   Ms. Broxton was instructed to call 911 with any severe reactions post vaccine: Marland Kitchen Difficulty breathing  . Swelling of face and throat  . A fast heartbeat  . A bad rash all over body  . Dizziness and weakness   Immunizations Administered    Name Date Dose VIS Date Route   Moderna COVID-19 Vaccine 08/19/2019 10:44 AM 0.5 mL 04/08/2019 Intramuscular   Manufacturer: Moderna   Lot: [id number]   NDC: 76283-151-76
